# Patient Record
Sex: Male | Born: 1995 | Race: White | Hispanic: No | Marital: Single | Smoking: Never smoker
Health system: Southern US, Community
[De-identification: ages and names within clinical notes are randomized; demographics above are authoritative.]

## PROBLEM LIST (undated history)

## (undated) HISTORY — PX: ELBOW SURGERY: SHX618

---
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
## 2017-07-30 ENCOUNTER — Emergency Department
Admission: EM | Admit: 2017-07-30 | Discharge: 2017-07-30 | Disposition: A | Discharge status: Home / Self Care | Payer: Self-pay | Attending: Surgery | Admitting: Surgery

## 2017-07-30 ENCOUNTER — Emergency Department: Payer: Self-pay

## 2017-07-30 DIAGNOSIS — R1031 Right lower quadrant pain: Secondary | ICD-10-CM | POA: Insufficient documentation

## 2017-07-30 LAB — CBC
HCT: 46.6 % (ref 40.0–52.0)
HEMOGLOBIN: 16.1 g/dL (ref 13.0–18.0)
MCH: 29.6 pg (ref 26.0–34.0)
MCHC: 34.6 g/dL (ref 32.0–36.0)
MCV: 85.6 fL (ref 80.0–100.0)
Platelets: 243 10*3/uL (ref 150–440)
RBC: 5.44 MIL/uL (ref 4.40–5.90)
RDW: 13 % (ref 11.5–14.5)
WBC: 6.5 10*3/uL (ref 3.8–10.6)

## 2017-07-30 LAB — URINALYSIS, COMPLETE (UACMP) WITH MICROSCOPIC
BACTERIA UA: NONE SEEN
Bilirubin Urine: NEGATIVE
GLUCOSE, UA: NEGATIVE mg/dL
Hgb urine dipstick: NEGATIVE
KETONES UR: NEGATIVE mg/dL
Leukocytes, UA: NEGATIVE
Nitrite: NEGATIVE
PROTEIN: NEGATIVE mg/dL
SQUAMOUS EPITHELIAL / LPF: NONE SEEN
Specific Gravity, Urine: 1.018 (ref 1.005–1.030)
pH: 6 (ref 5.0–8.0)

## 2017-07-30 LAB — COMPREHENSIVE METABOLIC PANEL
ALBUMIN: 5 g/dL (ref 3.5–5.0)
ALK PHOS: 91 U/L (ref 38–126)
ALT: 34 U/L (ref 17–63)
ANION GAP: 8 (ref 5–15)
AST: 27 U/L (ref 15–41)
BUN: 15 mg/dL (ref 6–20)
CALCIUM: 9.4 mg/dL (ref 8.9–10.3)
CHLORIDE: 103 mmol/L (ref 101–111)
CO2: 27 mmol/L (ref 22–32)
Creatinine, Ser: 0.94 mg/dL (ref 0.61–1.24)
GFR calc Af Amer: >60 mL/min (ref >60)
GFR calc non Af Amer: >60 mL/min (ref >60)
GLUCOSE: 107 mg/dL — AB (ref 65–99)
Potassium: 3.5 mmol/L (ref 3.5–5.1)
SODIUM: 138 mmol/L (ref 135–145)
Total Bilirubin: 0.7 mg/dL (ref 0.3–1.2)
Total Protein: 7.8 g/dL (ref 6.5–8.1)

## 2017-07-30 LAB — LIPASE, BLOOD: LIPASE: 20 U/L (ref 11–51)

## 2017-07-30 MED ORDER — AMOXICILLIN-POT CLAVULANATE 875-125 MG PO TABS: 1.0000 | ORAL_TABLET | Freq: Two times a day (BID) | ORAL | 0 refills | Status: AC
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
## 2017-07-30 NOTE — Consult Note (Signed)
Surgical Consultation  07/30/2017  Joe Russo is an 21 y.o. male.   Referring Physician: Corky Downs  CC: Right lower quadrant pain  HPI: This patient describes right lower quadrant pain present at McBurney's point where he points to. He states is been present for over a year it never goes away but is sometimes worse. Over the last week it is worsened and is as bad as it is ever been. He states it was worse last Wednesday 5 days ago but is much better today. He cannot clearly answer why he chose today to come to the emergency room. He denies fevers or chills denies nausea vomiting has had no diarrhea no melena no hematochezia has no knowledge of Crohn's disease in his family. His mother is present and confirms that there is no family history of Crohn's or other serious medical illnesses.  Patient works in [place] but does not smoke tobacco and does not drink much alcohol.  History reviewed. No pertinent past medical history.  Past Surgical History:  Procedure Laterality Date  . ELBOW SURGERY      No family history on file.  Social History:  reports that he has never smoked. He uses smokeless tobacco. He reports that he does not drink alcohol. His drug history is not on file.  Allergies: No Known Allergies  Medications reviewed.   Review of Systems:   Review of Systems  Constitutional: Negative for chills and fever.  HENT: Negative.   Eyes: Negative.   Respiratory: Negative.   Cardiovascular: Negative.   Gastrointestinal: Positive for abdominal pain. Negative for blood in stool, constipation, diarrhea, heartburn, melena, nausea and vomiting.  Genitourinary: Negative for dysuria and urgency.  Musculoskeletal: Negative.   Skin: Negative.   Neurological: Negative.   Endo/Heme/Allergies: Negative.   Psychiatric/Behavioral: Negative.      Physical Exam:  BP (!) 150/81   Pulse 93   Temp 98.1 F (36.7 C)   Resp 16   Ht _0  (1.854 m)   Wt 162 lb (73.5  kg)   SpO2 100%   BMI 21.37 kg/m   Physical Exam  Constitutional: He is oriented to person, place, and time and well-developed, well-nourished, and in no distress. No distress.  Tall thin male patient in no acute distress. He moves about the bed quite easily without any increase in pain.  HENT:  Head: Normocephalic and atraumatic.  Eyes: Pupils are equal, round, and reactive to light. Right eye exhibits no discharge. Left eye exhibits no discharge. No scleral icterus.  Neck: Normal range of motion. No JVD present.  Cardiovascular: Normal rate, regular rhythm and normal heart sounds.   Pulmonary/Chest: Effort normal and breath sounds normal. No respiratory distress. He has no wheezes.  Abdominal: Soft. He exhibits no distension. There is no tenderness. There is no rebound and no guarding.  Completely soft abdomen. I cannot elicit any tenderness at McBurney's point certainly there is no rebound or percussion tenderness and a negative Rovsing sign I can push deeply into the iliac fossa without any sign of tenderness or wincing or guarding.  Musculoskeletal: Normal range of motion. He exhibits no edema or tenderness.  Lymphadenopathy:    He has no cervical adenopathy.  Neurological: He is alert and oriented to person, place, and time.  Skin: Skin is warm and dry. No rash noted. He is not diaphoretic. No erythema.  Psychiatric: Mood and affect normal.  Vitals reviewed.     Results for orders placed or performed during the hospital  encounter of 07/30/17 (from the past 48 hour(s))  Lipase, blood     Status: None   Collection Time: 07/30/17  3:33 PM  Result Value Ref Range   Lipase 20 11 - 51 U/L  Comprehensive metabolic panel     Status: Abnormal   Collection Time: 07/30/17  3:33 PM  Result Value Ref Range   Sodium 138 135 - 145 mmol/L   Potassium 3.5 3.5 - 5.1 mmol/L   Chloride 103 101 - 111 mmol/L   CO2 27 22 - 32 mmol/L   Glucose, Bld 107 (H) 65 - 99 mg/dL   BUN 15 6 - 20 mg/dL    Creatinine, Ser 0.94 0.61 - 1.24 mg/dL   Calcium 9.4 8.9 - 10.3 mg/dL   Total Protein 7.8 6.5 - 8.1 g/dL   Albumin 5.0 3.5 - 5.0 g/dL   AST 27 15 - 41 U/L   ALT 34 17 - 63 U/L   Alkaline Phosphatase 91 38 - 126 U/L   Total Bilirubin 0.7 0.3 - 1.2 mg/dL   GFR calc non Af Amer >60 >60 mL/min   GFR calc Af Amer >60 >60 mL/min    Comment: (NOTE) The eGFR has been calculated using the CKD EPI equation. This calculation has not been validated in all clinical situations. eGFR's persistently <60 mL/min signify possible Chronic Kidney Disease.    Anion gap 8 5 - 15  CBC     Status: None   Collection Time: 07/30/17  3:33 PM  Result Value Ref Range   WBC 6.5 3.8 - 10.6 K/uL   RBC 5.44 4.40 - 5.90 MIL/uL   Hemoglobin 16.1 13.0 - 18.0 g/dL   HCT 46.6 40.0 - 52.0 %   MCV 85.6 80.0 - 100.0 fL   MCH 29.6 26.0 - 34.0 pg   MCHC 34.6 32.0 - 36.0 g/dL   RDW 13.0 11.5 - 14.5 %   Platelets 243 150 - 440 K/uL  Urinalysis, Complete w Microscopic     Status: Abnormal   Collection Time: 07/30/17  3:39 PM  Result Value Ref Range   Color, Urine YELLOW (A) YELLOW   APPearance CLEAR (A) CLEAR   Specific Gravity, Urine 1.018 1.005 - 1.030   pH 6.0 5.0 - 8.0   Glucose, UA NEGATIVE NEGATIVE mg/dL   Hgb urine dipstick NEGATIVE NEGATIVE   Bilirubin Urine NEGATIVE NEGATIVE   Ketones, ur NEGATIVE NEGATIVE mg/dL   Protein, ur NEGATIVE NEGATIVE mg/dL   Nitrite NEGATIVE NEGATIVE   Leukocytes, UA NEGATIVE NEGATIVE   RBC / HPF 0-5 0 - 5 RBC/hpf   WBC, UA 0-5 0 - 5 WBC/hpf   Bacteria, UA NONE SEEN NONE SEEN   Squamous Epithelial / LPF NONE SEEN NONE SEEN   Mucus PRESENT    Ct Renal Stone Study  Result Date: 07/30/2017 CLINICAL DATA:  21 year old male with history of right lower quadrant abdominal pain for 1 year, worsening today. EXAM: CT ABDOMEN AND PELVIS WITHOUT CONTRAST TECHNIQUE: Multidetector CT imaging of the abdomen and pelvis was performed following the standard protocol without IV contrast.  COMPARISON:  No priors. FINDINGS: Lower chest: Unremarkable. Hepatobiliary: No definite cystic or solid hepatic lesions are identified on today's noncontrast CT examination. Unenhanced appearance of the gallbladder is normal. Pancreas: No definite pancreatic mass or peripancreatic inflammatory changes are noted on today's noncontrast CT examination. Spleen: Unremarkable. Adrenals/Urinary Tract: There are no abnormal calcifications within the collecting system of either kidney, along the course of either ureter, or within the  lumen of the urinary bladder. No hydroureteronephrosis or perinephric stranding to suggest urinary tract obstruction at this time. The unenhanced appearance of the kidneys is unremarkable bilaterally. Unenhanced appearance of the urinary bladder is normal. Bilateral adrenal glands are normal in appearance. Stomach/Bowel: Unenhanced appearance of the stomach is normal. No pathologic dilatation of small bowel or colon. Although the appendix does not appear enlarged, there do appear to be some very subtle inflammatory changes adjacent to the appendix, best appreciated on axial image 67 of series 2, which could be indicative of very early acute appendicitis. No signs to suggest frank periappendiceal abscess or perforation. No appendicolith. Vascular/Lymphatic: No atherosclerotic calcifications or definite aneurysm identified in the abdominal or pelvic vasculature. No lymphadenopathy noted in the abdomen or pelvis. Reproductive: Prostate gland and seminal vesicles are unremarkable in appearance. Other: No significant volume of ascites.  No pneumoperitoneum. Musculoskeletal: There are no aggressive appearing lytic or blastic lesions noted in the visualized portions of the skeleton. IMPRESSION: 1. Subtle inflammatory changes adjacent to the appendix. Notably, there is no appendicolith, and the appendix is otherwise normal in size and appearance. Findings are considered equivocal, however, given the  possible inflammatory changes correlation for signs and symptoms of acute appendicitis is recommended. 2. No other acute findings are noted elsewhere in the abdomen or pelvis. Electronically Signed   By: Vinnie Langton M.D.   On: 07/30/2017 16:19    Assessment/Plan:  This a patient with one year of right lower quadrant pain he points specifically at McBurney's point but on exam his exam is as close to being normal is anyone could possibly have. He has absolutely no tenderness and certainly no peritoneal signs. I offered the patient observation but I would not offer laparoscopy in this patient his white blood cell count is normal and his CT scan is been personally reviewed. I suggested that we try a course of Augmentin and follow-up in our office. He may require referral to GI as well. Questions were answered for he and his mother and they understood and agreed to proceed this is discussed with Dr. Frederica Kuster, MD, FACS

## 2017-07-30 NOTE — ED Triage Notes (Signed)
Pt c/o right lower quadrant pain for over 1 year, pt reports came in today because it is worsening. Denies n/v/d. VS stable.

## 2017-07-30 NOTE — ED Notes (Signed)
ED Provider at bedside. 

## 2017-07-30 NOTE — ED Notes (Signed)
Dr. Excell Seltzer in speaking with pt

## 2021-02-08 ENCOUNTER — Emergency Department: Payer: No Typology Code available for payment source

## 2021-02-08 ENCOUNTER — Other Ambulatory Visit: Payer: Self-pay

## 2021-02-08 ENCOUNTER — Emergency Department
Admission: EM | Admit: 2021-02-08 | Discharge: 2021-02-08 | Disposition: A | Discharge status: Home / Self Care | Payer: No Typology Code available for payment source | Attending: Emergency Medicine | Admitting: Emergency Medicine

## 2021-02-08 DIAGNOSIS — S80211A Abrasion, right knee, initial encounter: Secondary | ICD-10-CM | POA: Insufficient documentation

## 2021-02-08 DIAGNOSIS — S40212A Abrasion of left shoulder, initial encounter: Secondary | ICD-10-CM | POA: Diagnosis not present

## 2021-02-08 DIAGNOSIS — S60512A Abrasion of left hand, initial encounter: Secondary | ICD-10-CM | POA: Insufficient documentation

## 2021-02-08 DIAGNOSIS — S0291XA Unspecified fracture of skull, initial encounter for closed fracture: Secondary | ICD-10-CM | POA: Insufficient documentation

## 2021-02-08 DIAGNOSIS — Y92480 Sidewalk as the place of occurrence of the external cause: Secondary | ICD-10-CM | POA: Insufficient documentation

## 2021-02-08 DIAGNOSIS — Y9355 Activity, bike riding: Secondary | ICD-10-CM | POA: Insufficient documentation

## 2021-02-08 DIAGNOSIS — S0990XA Unspecified injury of head, initial encounter: Secondary | ICD-10-CM | POA: Diagnosis present

## 2021-02-08 DIAGNOSIS — Z20822 Contact with and (suspected) exposure to covid-19: Secondary | ICD-10-CM | POA: Diagnosis not present

## 2021-02-08 DIAGNOSIS — S064XAA Epidural hemorrhage with loss of consciousness status unknown, initial encounter: Secondary | ICD-10-CM

## 2021-02-08 DIAGNOSIS — Z23 Encounter for immunization: Secondary | ICD-10-CM | POA: Diagnosis not present

## 2021-02-08 DIAGNOSIS — S064X9A Epidural hemorrhage with loss of consciousness of unspecified duration, initial encounter: Secondary | ICD-10-CM | POA: Insufficient documentation

## 2021-02-08 DIAGNOSIS — S40211A Abrasion of right shoulder, initial encounter: Secondary | ICD-10-CM | POA: Insufficient documentation

## 2021-02-08 DIAGNOSIS — S30810A Abrasion of lower back and pelvis, initial encounter: Secondary | ICD-10-CM | POA: Insufficient documentation

## 2021-02-08 LAB — RESP PANEL BY RT-PCR (FLU A&B, COVID) ARPGX2
Influenza A by PCR: NEGATIVE
Influenza B by PCR: NEGATIVE
SARS Coronavirus 2 by RT PCR: NEGATIVE

## 2021-02-08 LAB — CBC WITH DIFFERENTIAL/PLATELET
Abs Immature Granulocytes: 0.02 10*3/uL (ref 0.00–0.07)
Basophils Absolute: 0 10*3/uL (ref 0.0–0.1)
Basophils Relative: 0 %
Eosinophils Absolute: 0 10*3/uL (ref 0.0–0.5)
Eosinophils Relative: 0 %
HCT: 47.2 % (ref 39.0–52.0)
Hemoglobin: 16 g/dL (ref 13.0–17.0)
Immature Granulocytes: 0 %
Lymphocytes Relative: 12 %
Lymphs Abs: 1.1 10*3/uL (ref 0.7–4.0)
MCH: 29.5 pg (ref 26.0–34.0)
MCHC: 33.9 g/dL (ref 30.0–36.0)
MCV: 86.9 fL (ref 80.0–100.0)
Monocytes Absolute: 0.7 10*3/uL (ref 0.1–1.0)
Monocytes Relative: 8 %
Neutro Abs: 6.9 10*3/uL (ref 1.7–7.7)
Neutrophils Relative %: 80 %
Platelets: 214 10*3/uL (ref 150–400)
RBC: 5.43 MIL/uL (ref 4.22–5.81)
RDW: 12.2 % (ref 11.5–15.5)
WBC: 8.8 10*3/uL (ref 4.0–10.5)
nRBC: 0 % (ref 0.0–0.2)

## 2021-02-08 LAB — COMPREHENSIVE METABOLIC PANEL
ALT: 22 U/L (ref 0–44)
AST: 21 U/L (ref 15–41)
Albumin: 4.9 g/dL (ref 3.5–5.0)
Alkaline Phosphatase: 75 U/L (ref 38–126)
Anion gap: 12 (ref 5–15)
BUN: 18 mg/dL (ref 6–20)
CO2: 24 mmol/L (ref 22–32)
Calcium: 9.3 mg/dL (ref 8.9–10.3)
Chloride: 102 mmol/L (ref 98–111)
Creatinine, Ser: 0.86 mg/dL (ref 0.61–1.24)
GFR, Estimated: >60 mL/min (ref >60)
Glucose, Bld: 103 mg/dL — ABNORMAL HIGH (ref 70–99)
Potassium: 3.6 mmol/L (ref 3.5–5.1)
Sodium: 138 mmol/L (ref 135–145)
Total Bilirubin: 1.3 mg/dL — ABNORMAL HIGH (ref 0.3–1.2)
Total Protein: 7.3 g/dL (ref 6.5–8.1)

## 2021-02-08 LAB — PROTIME-INR
INR: 1.1 (ref 0.8–1.2)
Prothrombin Time: 14.1 seconds (ref 11.4–15.2)

## 2021-02-08 LAB — TYPE AND SCREEN
ABO/RH(D): O POS
Antibody Screen: NEGATIVE

## 2021-02-08 LAB — ETHANOL: Alcohol, Ethyl (B): <10 mg/dL (ref <10)

## 2021-02-08 MED ORDER — ACETAMINOPHEN 500 MG PO TABS
1000.0000 mg | ORAL_TABLET | Freq: Once | ORAL | Status: DC
  Filled 2021-02-08: qty 2

## 2021-02-08 MED ORDER — TETANUS-DIPHTH-ACELL PERTUSSIS 5-2.5-18.5 LF-MCG/0.5 IM SUSY
0.5000 mL | PREFILLED_SYRINGE | Freq: Once | INTRAMUSCULAR | Status: AC
  Administered 2021-02-08: 0.5 mL via INTRAMUSCULAR
  Filled 2021-02-08: qty 0.5

## 2021-02-08 NOTE — ED Notes (Signed)
Neurosurgery consulting MD at bedside.
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
## 2021-02-08 NOTE — ED Notes (Signed)
Mother at bedside.

## 2021-02-11 ENCOUNTER — Other Ambulatory Visit: Payer: Self-pay | Admitting: Neurosurgery

## 2021-02-11 DIAGNOSIS — S064XAA Epidural hemorrhage with loss of consciousness status unknown, initial encounter: Secondary | ICD-10-CM

## 2021-02-11 DIAGNOSIS — S064X9A Epidural hemorrhage with loss of consciousness of unspecified duration, initial encounter: Secondary | ICD-10-CM

## 2021-02-11 DIAGNOSIS — S0291XD Unspecified fracture of skull, subsequent encounter for fracture with routine healing: Secondary | ICD-10-CM

## 2021-02-23 ENCOUNTER — Ambulatory Visit: Payer: No Typology Code available for payment source

## 2021-02-25 ENCOUNTER — Other Ambulatory Visit: Payer: Self-pay

## 2021-02-25 ENCOUNTER — Ambulatory Visit
Admission: RE | Admit: 2021-02-25 | Discharge: 2021-02-25 | Disposition: A | Discharge status: Home / Self Care | Payer: Self-pay | Source: Ambulatory Visit | Attending: Neurosurgery | Admitting: Neurosurgery

## 2021-02-25 DIAGNOSIS — S0291XD Unspecified fracture of skull, subsequent encounter for fracture with routine healing: Secondary | ICD-10-CM | POA: Insufficient documentation

## 2021-02-25 DIAGNOSIS — S064X9A Epidural hemorrhage with loss of consciousness of unspecified duration, initial encounter: Secondary | ICD-10-CM | POA: Insufficient documentation

## 2021-02-25 DIAGNOSIS — S064XAA Epidural hemorrhage with loss of consciousness status unknown, initial encounter: Secondary | ICD-10-CM
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
